# Patient Record
Sex: Female | Born: 1985 | Race: Black or African American | Hispanic: No | Marital: Single | Smoking: Never smoker
Health system: Southern US, Community
[De-identification: ages and names within clinical notes are randomized; demographics above are authoritative.]

---

## 2008-02-05 ENCOUNTER — Emergency Department (HOSPITAL_COMMUNITY): Admission: EM | Admit: 2008-02-05 | Discharge: 2008-02-05 | Payer: Self-pay | Admitting: Emergency Medicine

## 2008-02-07 ENCOUNTER — Emergency Department (HOSPITAL_COMMUNITY): Admission: EM | Admit: 2008-02-07 | Discharge: 2008-02-07 | Payer: Self-pay | Admitting: Family Medicine

## 2008-06-20 ENCOUNTER — Emergency Department (HOSPITAL_COMMUNITY): Admission: EM | Admit: 2008-06-20 | Discharge: 2008-06-20 | Payer: Self-pay | Admitting: Emergency Medicine

## 2008-06-22 ENCOUNTER — Emergency Department (HOSPITAL_COMMUNITY): Admission: EM | Admit: 2008-06-22 | Discharge: 2008-06-22 | Payer: Self-pay | Admitting: Emergency Medicine

## 2009-09-09 ENCOUNTER — Emergency Department (HOSPITAL_COMMUNITY): Admission: EM | Admit: 2009-09-09 | Discharge: 2009-09-09 | Payer: Self-pay | Admitting: Emergency Medicine

## 2010-04-12 LAB — WET PREP, GENITAL: Trich, Wet Prep: NONE SEEN

## 2010-04-12 LAB — GC/CHLAMYDIA PROBE AMP, GENITAL: Chlamydia, DNA Probe: NEGATIVE

## 2010-09-13 IMAGING — CR DG ELBOW COMPLETE 3+V*L*
4 series · 4 of 4 positions shown · non-contrast
Comparison: None

CLINICAL DATA: Pain status post fall

LEFT ELBOW - COMPLETE 3+ VIEW

[x elbow joint ap left]
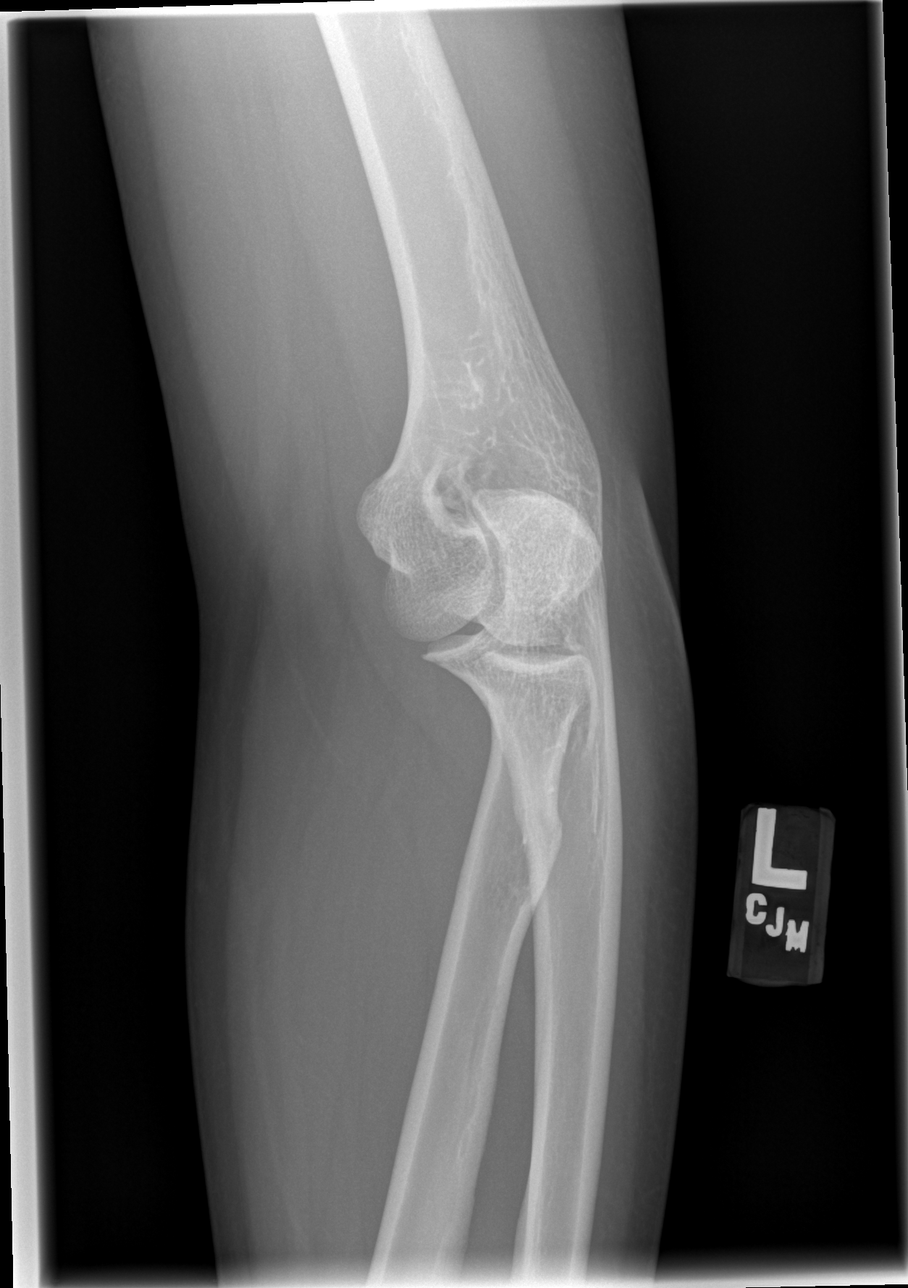

[x elbow joint obl. left (1 of 2)]
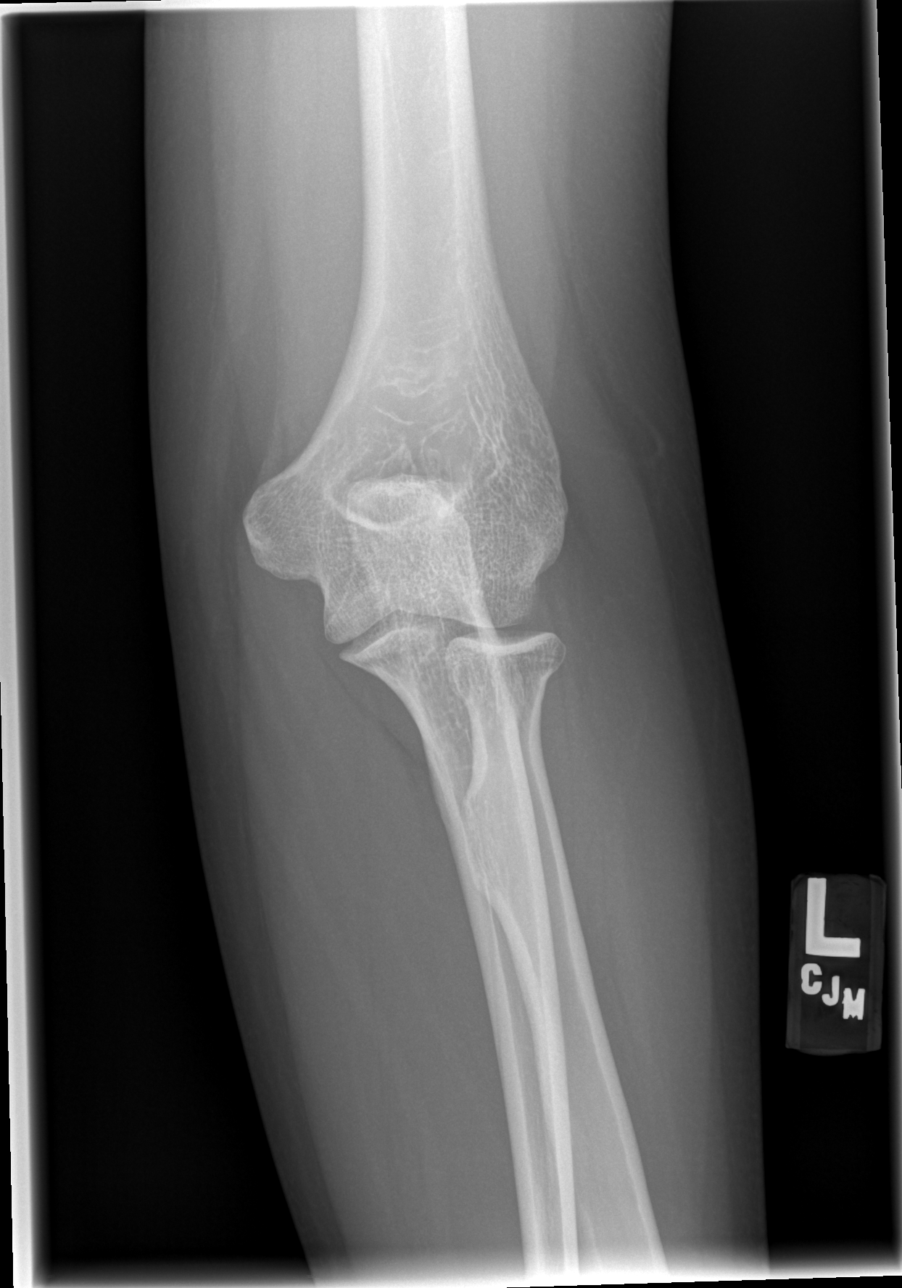

[x elbow joint obl. left (2 of 2)]
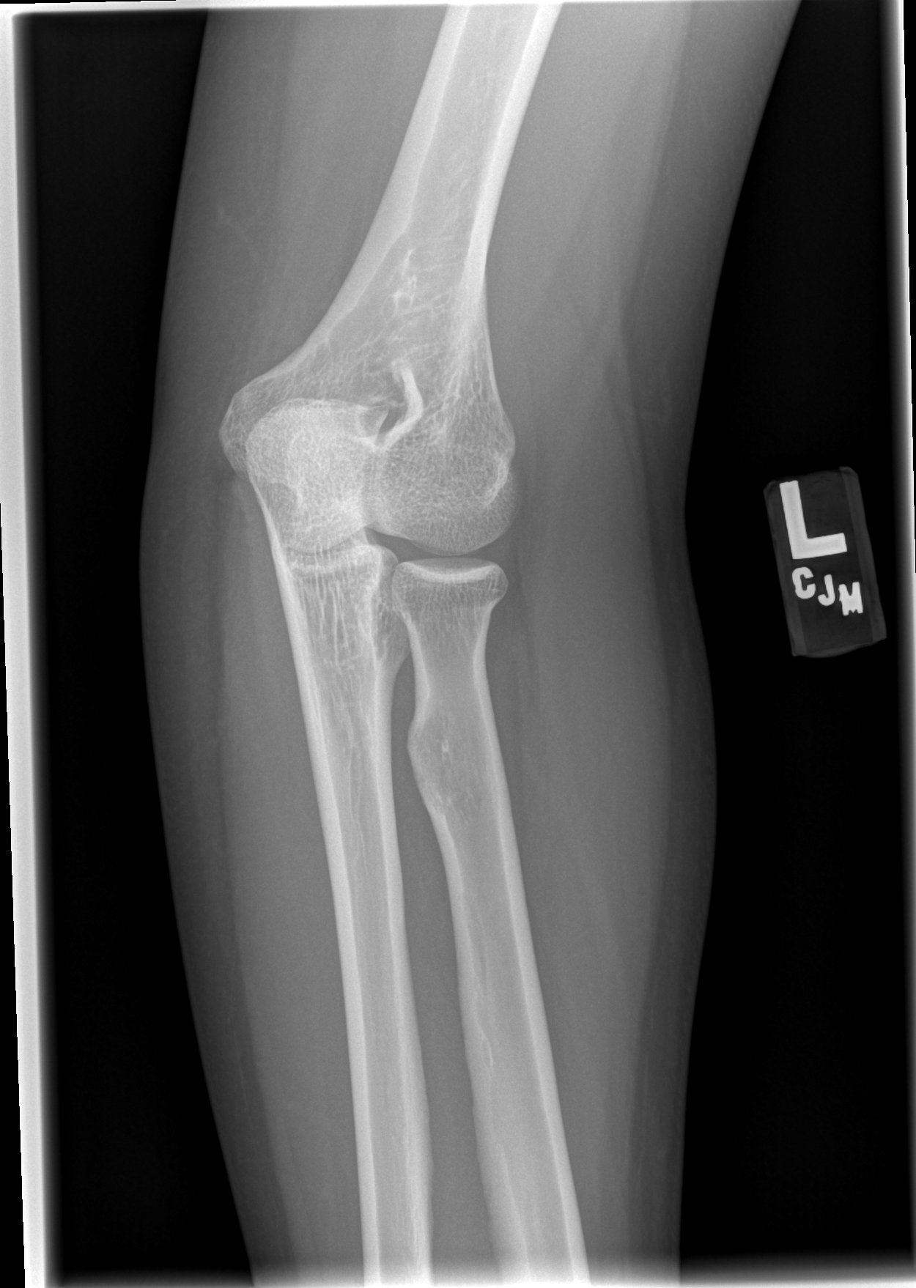

[x elbow joint lat left]
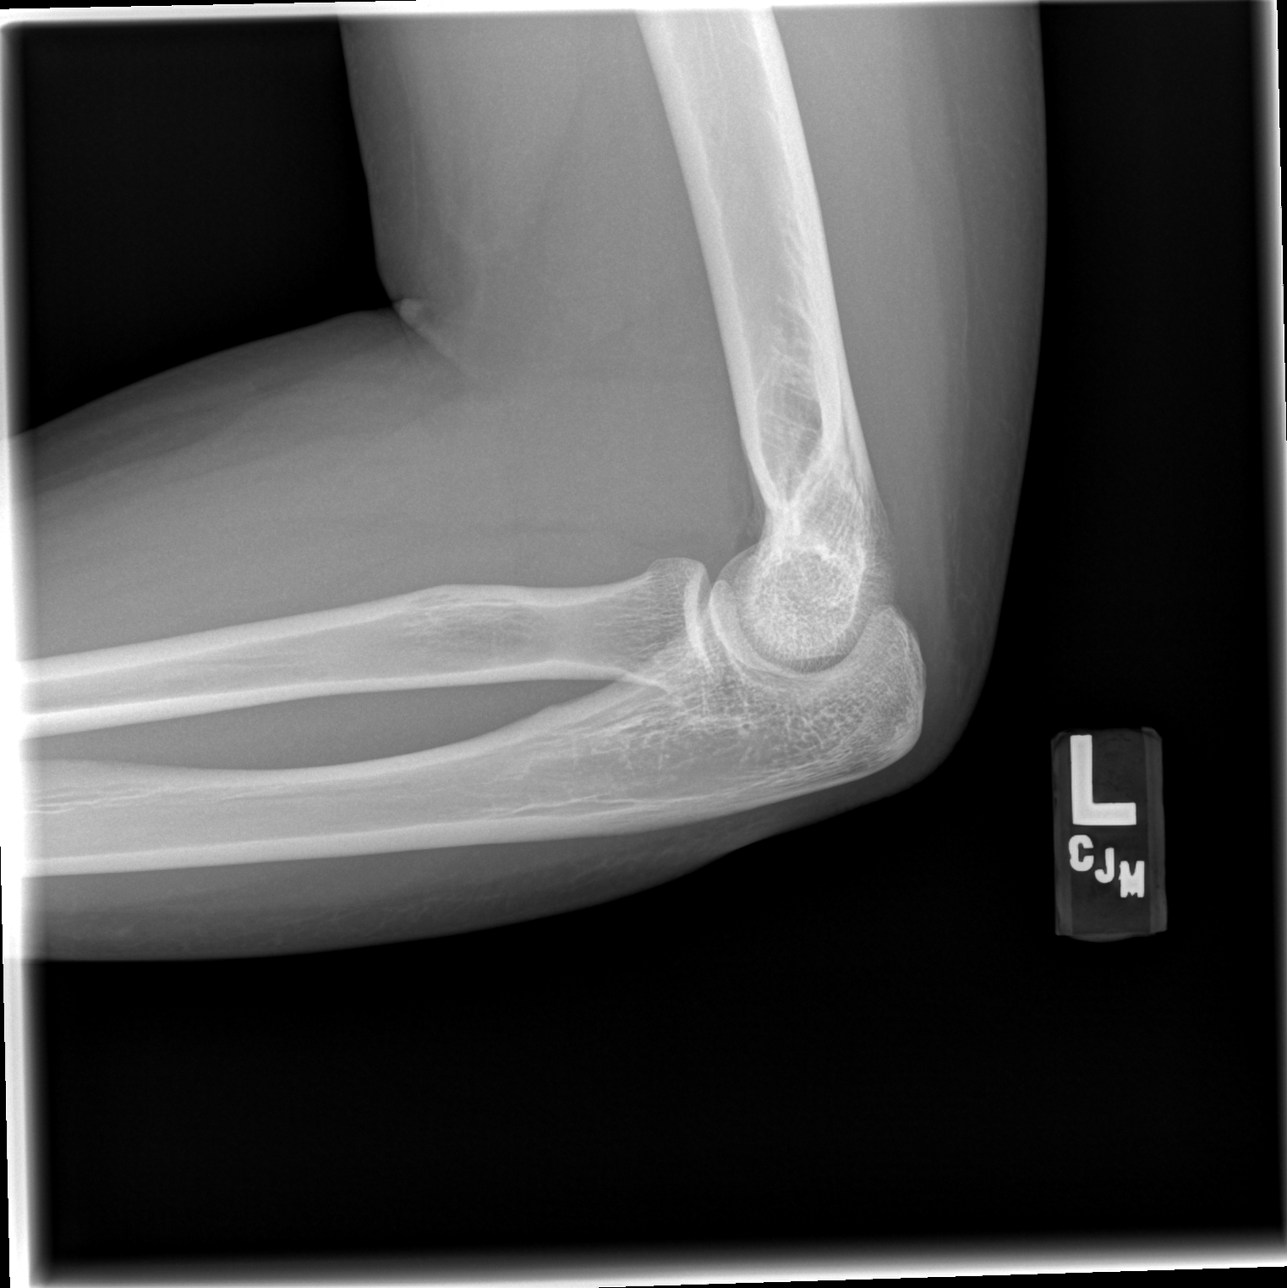

[4 of 4 positions shown; findings below may reference images not displayed]

FINDINGS: Bone mineralization normal.
Joint spaces preserved.
No fracture, dislocation, or bone destruction.
No joint effusion.
IMPRESSION: No definite acute bony abnormalities.

## 2013-01-30 ENCOUNTER — Other Ambulatory Visit (HOSPITAL_COMMUNITY)
Admission: RE | Admit: 2013-01-30 | Discharge: 2013-01-30 | Disposition: A | Discharge status: Home / Self Care | Payer: No Typology Code available for payment source | Source: Ambulatory Visit | Attending: Family Medicine | Admitting: Family Medicine

## 2013-01-30 ENCOUNTER — Encounter (HOSPITAL_COMMUNITY): Payer: Self-pay | Admitting: Emergency Medicine

## 2013-01-30 ENCOUNTER — Emergency Department (HOSPITAL_COMMUNITY)
Admission: EM | Admit: 2013-01-30 | Discharge: 2013-01-30 | Disposition: A | Discharge status: Home / Self Care | Payer: No Typology Code available for payment source | Source: Home / Self Care

## 2013-01-30 DIAGNOSIS — N760 Acute vaginitis: Secondary | ICD-10-CM

## 2013-01-30 DIAGNOSIS — Z113 Encounter for screening for infections with a predominantly sexual mode of transmission: Secondary | ICD-10-CM | POA: Insufficient documentation

## 2013-01-30 LAB — POCT PREGNANCY, URINE: PREG TEST UR: NEGATIVE

## 2013-01-30 MED ORDER — LIDOCAINE HCL (PF) 1 % IJ SOLN
INTRAMUSCULAR | Status: AC
  Filled 2013-01-30: qty 5

## 2013-01-30 MED ORDER — CEFTRIAXONE SODIUM 250 MG IJ SOLR
250.0000 mg | Freq: Once | INTRAMUSCULAR | Status: AC
  Administered 2013-01-30: 250 mg via INTRAMUSCULAR

## 2013-01-30 MED ORDER — AZITHROMYCIN 250 MG PO TABS
1000.0000 mg | ORAL_TABLET | Freq: Once | ORAL | Status: AC
  Administered 2013-01-30: 1000 mg via ORAL

## 2013-01-30 MED ORDER — AZITHROMYCIN 250 MG PO TABS
ORAL_TABLET | ORAL | Status: AC
  Filled 2013-01-30: qty 4

## 2013-01-30 MED ORDER — CEFTRIAXONE SODIUM 250 MG IJ SOLR
INTRAMUSCULAR | Status: AC
  Filled 2013-01-30: qty 250

## 2013-01-30 NOTE — ED Notes (Signed)
C/o vaginal discharge with odor onset 1 week ago after her period ended.  Hx. same and it was bacterial vaginosis.  Denies that it could be an STD.

## 2013-01-30 NOTE — Discharge Instructions (Signed)
You have been treated for gonorrhea and chlamydia tonight. If your culture results indicate the need for additional treatment, you will be notified by telephone. No intercourse until resolution of symptoms.

## 2013-01-30 NOTE — ED Provider Notes (Signed)
CSN: [id number]     Arrival date & time 01/30/13  1649 History   None    Chief Complaint  Patient presents with  . Vaginal Discharge   (Consider location/radiation/quality/duration/timing/severity/associated sxs/prior Treatment) HPI Comments: LNMP 01/16/2013  Patient is a 28 y.o. female presenting with vaginal discharge. The history is provided by the patient.  Vaginal Discharge Quality:  Malodorous and yellow Onset quality:  Gradual Duration:  1 week Timing:  Constant Progression:  Unchanged Chronicity:  New Associated symptoms: no abdominal pain, no dyspareunia, no dysuria, no fever, no genital lesions, no nausea, no rash, no urinary frequency, no urinary hesitancy, no urinary incontinence, no vaginal itching and no vomiting   Risk factors comment:  Prior hx of BV   History reviewed. No pertinent past medical history. History reviewed. No pertinent past surgical history. Family History  Problem Relation Age of Onset  . Diabetes Father    History  Substance Use Topics  . Smoking status: Never Smoker   . Smokeless tobacco: Not on file  . Alcohol Use: Yes     Comment: occasional   OB History   Grav Para Term Preterm Abortions TAB SAB Ect Mult Living                 Review of Systems  Constitutional: Negative for fever.  Gastrointestinal: Negative for nausea, vomiting and abdominal pain.  Genitourinary: Positive for vaginal discharge. Negative for bladder incontinence, dysuria, hesitancy and dyspareunia.  All other systems reviewed and are negative.    Allergies  Review of patient's allergies indicates no known allergies.  Home Medications  No current outpatient prescriptions on file. BP 112/71  Pulse 75  Temp(Src) 98.4 F (36.9 C) (Oral)  Resp 18  SpO2 98%  LMP 01/16/2013 Physical Exam  Nursing note and vitals reviewed. Constitutional: She is oriented to person, place, and time. She appears well-developed and well-nourished. No distress.  HENT:  Head:  Normocephalic and atraumatic.  Eyes: Conjunctivae are normal.  Neck: Normal range of motion. Neck supple.  Cardiovascular: Normal rate.   Pulmonary/Chest: Effort normal.  Abdominal: Soft. She exhibits no distension. There is no tenderness.  Genitourinary: Uterus normal. No labial fusion. There is no rash, tenderness, lesion or injury on the right labia. There is no rash, tenderness, lesion or injury on the left labia. Cervix exhibits no motion tenderness, no discharge and no friability. Right adnexum displays no mass, no tenderness and no fullness. Left adnexum displays no mass, no tenderness and no fullness. No erythema, tenderness or bleeding around the vagina. No foreign body around the vagina. Vaginal discharge found.  Musculoskeletal: Normal range of motion.  Neurological: She is alert and oriented to person, place, and time.  Skin: Skin is warm and dry. No rash noted.  Psychiatric: She has a normal mood and affect. Her behavior is normal.    ED Course  Procedures (including critical care time) Labs Review Labs Reviewed - No data to display Imaging Review No results found.  EKG Interpretation    Date/Time:    Ventricular Rate:    PR Interval:    QRS Duration:   QT Interval:    QTC Calculation:   R Axis:     Text Interpretation:              MDM  cervicovaginal swabs sent. No clinical evidence of PID or TOA. Patient chooses to have empiric treatment for GC and chlamydia. Will notify of additional culture results when available. Declined syphilis or HIV testing.  Jess BartersJennifer Lee Frisco CityPresson, [place] 01/30/13 [id number]

## 2013-01-31 NOTE — ED Provider Notes (Signed)
Medical screening examination/treatment/procedure(s) were performed by a resident physician or non-physician practitioner and as the supervising physician I was immediately available for consultation/collaboration.  Clementeen GrahamEvan Tabytha Gradillas, MD    Rodolph BongEvan S Amandalynn Pitz, MD 01/31/13 2111

## 2013-02-02 ENCOUNTER — Telehealth (HOSPITAL_COMMUNITY): Payer: Self-pay | Admitting: *Deleted

## 2013-02-02 NOTE — ED Notes (Addendum)
GC/Chlamydia neg., Affirm: Candida and Trich neg., Gardnerella pos. 1/6   Message from Rite AidLee Presson PA to call Rx. of Flagyl 500 mg. BID x 7 days #14, if pt. would like to be treated. I called and left a message to call. Call 1. Vassie MoselleYork, Kenyana Husak M 02/02/2013 1/9 I called pt. and left a message to call.  When I opened pt.'s chart to document, I could see that she came in yesterday and had her Rx. of Flagyl called in already.  I called pt. back on her VM and told her to disregard my previous call because I noted on her chart that she was notified and treated yesterday.

## 2013-02-03 NOTE — ED Notes (Signed)
Patient came to [facility] for results, verified results with smith, pa.  .  Reported results to patient, called in flagyl order as ordered by lee presson, pa in previous documentation.  Verified medication order with smith, pa.

## 2013-02-15 NOTE — ED Notes (Signed)
Pt. called on my VM 1/6 for her lab results. Pt already got the results. 02/15/2013

## 2013-06-18 ENCOUNTER — Encounter (HOSPITAL_COMMUNITY): Payer: Self-pay | Admitting: Emergency Medicine

## 2013-06-18 ENCOUNTER — Emergency Department (HOSPITAL_COMMUNITY)
Admission: EM | Admit: 2013-06-18 | Discharge: 2013-06-18 | Disposition: A | Discharge status: Home / Self Care | Payer: No Typology Code available for payment source | Source: Home / Self Care | Attending: Family Medicine | Admitting: Family Medicine

## 2013-06-18 ENCOUNTER — Other Ambulatory Visit (HOSPITAL_COMMUNITY)
Admission: RE | Admit: 2013-06-18 | Discharge: 2013-06-18 | Disposition: A | Discharge status: Home / Self Care | Payer: No Typology Code available for payment source | Source: Ambulatory Visit | Attending: Family Medicine | Admitting: Family Medicine

## 2013-06-18 DIAGNOSIS — B9689 Other specified bacterial agents as the cause of diseases classified elsewhere: Secondary | ICD-10-CM

## 2013-06-18 DIAGNOSIS — Z113 Encounter for screening for infections with a predominantly sexual mode of transmission: Secondary | ICD-10-CM | POA: Insufficient documentation

## 2013-06-18 DIAGNOSIS — N760 Acute vaginitis: Secondary | ICD-10-CM | POA: Insufficient documentation

## 2013-06-18 DIAGNOSIS — A499 Bacterial infection, unspecified: Secondary | ICD-10-CM

## 2013-06-18 MED ORDER — METRONIDAZOLE 500 MG PO TABS: 500.0000 mg | ORAL_TABLET | Freq: Two times a day (BID) | ORAL | Status: DC

## 2013-06-18 NOTE — ED Provider Notes (Signed)
CSN: [id number]     Arrival date & time 06/18/13  1649 History   First MD Initiated Contact with Patient 06/18/13 1808     Chief Complaint  Patient presents with  . Vaginal Discharge   (Consider location/radiation/quality/duration/timing/severity/associated sxs/prior Treatment) Patient is a 28 y.o. female presenting with vaginal discharge. The history is provided by the patient.  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 No pertinent past medical history. History reviewed. No pertinent past surgical history. Family History  Problem Relation Age of Onset  . Diabetes Father    History  Substance Use Topics  . Smoking status: Never Smoker   . Smokeless tobacco: Not on file  . Alcohol Use: Yes     Comment: occasional   OB History   Grav Para Term Preterm Abortions TAB SAB Ect Mult Living                 Review of Systems  Constitutional: Negative for fever.  Gastrointestinal: Negative for abdominal pain.  Genitourinary: Positive for vaginal discharge. Negative for dysuria, urgency, vaginal bleeding, menstrual problem and pelvic pain.    Allergies  Review of patient's allergies indicates no known allergies.  Home Medications   Prior to Admission medications   Not on File   BP 121/65  Pulse 79  Temp(Src) 98.2 F (36.8 C) (Oral)  Resp 18  SpO2 100%  LMP 05/21/2013 Physical Exam  Nursing note and vitals reviewed. Constitutional: She is oriented to person, place, and time. She appears well-developed and well-nourished.  Abdominal: Soft. Bowel sounds are normal.  Genitourinary: Uterus normal. Cervix exhibits no motion tenderness, no discharge and no friability. Right adnexum displays no tenderness. Left adnexum displays no tenderness. No erythema,  tenderness or bleeding around the vagina. Vaginal discharge found.  Pos whiff test.  Neurological: She is alert and oriented to person, place, and time.  Skin: Skin is warm and dry.    ED Course  Procedures (including critical care time) Labs Review Labs Reviewed - No data to display  Imaging Review No results found.   MDM   1. BV (bacterial vaginosis)       Linna Hoff, MD 06/18/13 [phone]

## 2013-06-18 NOTE — Discharge Instructions (Signed)
Use medicine as prescribed, we will call if tests show a need for other treatment.  

## 2013-06-18 NOTE — ED Notes (Signed)
Pt here with c/o vaginal yellowish d/c with odor that started x 3 dys ago Denies n/v/fever or back pain LMP- 4/25

## 2013-06-21 NOTE — ED Notes (Signed)
GC/Chlamydia neg., Affirm: Candida and Trich neg., Gardnerella pos.  Pt. adequately treated with Flagyl. Ricarda Atayde M Krystian Ferrentino 06/21/2013  

## 2016-02-12 ENCOUNTER — Other Ambulatory Visit: Payer: Self-pay | Admitting: Obstetrics & Gynecology

## 2016-02-12 DIAGNOSIS — N6452 Nipple discharge: Secondary | ICD-10-CM

## 2016-02-19 ENCOUNTER — Other Ambulatory Visit: Payer: No Typology Code available for payment source

## 2019-01-14 ENCOUNTER — Ambulatory Visit: Payer: No Typology Code available for payment source | Attending: Internal Medicine

## 2019-01-14 DIAGNOSIS — Z20822 Contact with and (suspected) exposure to covid-19: Secondary | ICD-10-CM

## 2019-01-15 LAB — NOVEL CORONAVIRUS, NAA: SARS-CoV-2, NAA: NOT DETECTED

## 2019-01-17 ENCOUNTER — Other Ambulatory Visit: Payer: No Typology Code available for payment source

## 2019-11-24 LAB — HIV ANTIBODY (ROUTINE TESTING W REFLEX): HIV 1&2 Ab, 4th Generation: NEGATIVE

## 2019-11-24 LAB — TESTOSTERONE: Testosterone: NORMAL

## 2020-02-07 ENCOUNTER — Other Ambulatory Visit: Payer: No Typology Code available for payment source

## 2020-02-07 ENCOUNTER — Other Ambulatory Visit: Payer: Self-pay

## 2020-02-07 DIAGNOSIS — Z20822 Contact with and (suspected) exposure to covid-19: Secondary | ICD-10-CM

## 2020-02-09 LAB — NOVEL CORONAVIRUS, NAA: SARS-CoV-2, NAA: NOT DETECTED

## 2020-02-09 LAB — SARS-COV-2, NAA 2 DAY TAT

## 2020-02-21 ENCOUNTER — Other Ambulatory Visit: Payer: Self-pay

## 2020-02-21 ENCOUNTER — Other Ambulatory Visit: Payer: No Typology Code available for payment source

## 2020-02-21 DIAGNOSIS — Z20822 Contact with and (suspected) exposure to covid-19: Secondary | ICD-10-CM

## 2020-02-22 LAB — NOVEL CORONAVIRUS, NAA: SARS-CoV-2, NAA: NOT DETECTED

## 2020-02-22 LAB — SARS-COV-2, NAA 2 DAY TAT

## 2020-06-05 ENCOUNTER — Other Ambulatory Visit: Payer: Self-pay

## 2020-06-06 ENCOUNTER — Ambulatory Visit (INDEPENDENT_AMBULATORY_CARE_PROVIDER_SITE_OTHER): Payer: Managed Care, Other (non HMO) | Admitting: Nurse Practitioner

## 2020-06-06 ENCOUNTER — Encounter: Payer: Self-pay | Admitting: Nurse Practitioner

## 2020-06-06 VITALS — BP 120/74 | HR 88 | Temp 98.1°F | Ht 68.0 in | Wt 214.4 lb

## 2020-06-06 DIAGNOSIS — Z136 Encounter for screening for cardiovascular disorders: Secondary | ICD-10-CM

## 2020-06-06 DIAGNOSIS — Z13220 Encounter for screening for lipoid disorders: Secondary | ICD-10-CM | POA: Diagnosis not present

## 2020-06-06 DIAGNOSIS — M25552 Pain in left hip: Secondary | ICD-10-CM

## 2020-06-06 DIAGNOSIS — Z0001 Encounter for general adult medical examination with abnormal findings: Secondary | ICD-10-CM

## 2020-06-06 NOTE — Progress Notes (Signed)
Subjective:    Patient ID: Tina Mcbride, female    DOB: July 14, 1985, 35 y.o.   MRN: [id number]  Patient presents today for CPE and eval of an acute condition  Hip Pain  Incident onset: 72months ago. The incident occurred at home. The injury mechanism was a twisting injury. The pain is present in the left hip and left thigh. The quality of the pain is described as aching. The pain is mild. The pain has been intermittent since onset. Pertinent negatives include no inability to bear weight, loss of motion, loss of sensation, muscle weakness, numbness or tingling. She reports no foreign bodies present. The symptoms are aggravated by movement (external rotation of hip). She has tried nothing for the symptoms.   Sexual History (orientation,birth control, marital status, STD):sexually active, female partner, use of condoms, pelvic and breast exam completed by GYN per aptient  Depression/Suicide: Depression screen [facility] 2/9 06/06/2020  Decreased Interest 0  Down, Depressed, Hopeless 0  PHQ - 2 Score 0  Altered sleeping 0  Tired, decreased energy 2  Change in appetite 0  Feeling bad or failure about yourself  0  Trouble concentrating 0  Moving slowly or fidgety/restless 0  Suicidal thoughts 0  PHQ-9 Score 2  Difficult doing work/chores Somewhat difficult   Vision:up to date  Dental:up to date  Immunizations: (TDAP, Hep C screen, Pneumovax, Influenza, zoster)  Health Maintenance  Topic Date Due  . Tetanus Vaccine  Never done  . Pap Smear  Never done  . COVID-19 Vaccine (3 - Booster for Pfizer series) 03/10/2020  . Hepatitis C Screening: USPSTF Recommendation to screen - Ages 18-79 yo.  06/06/2021*  . HIV Screening  06/06/2021*  . Flu Shot  08/27/2020  . HPV Vaccine  Aged Out  *Topic was postponed. The date shown is not the original due date.   Diet:regular Exercise: 2-3x/week: treadmill and videos Weight:  Wt Readings from Last 3 Encounters:  06/06/20 214 lb 6.4 oz (97.3 kg)   Fall  Risk: Fall Risk  06/06/2020  Falls in the past year? 0  Number falls in past yr: 0  Injury with Fall? 0  Risk for fall due to : No Fall Risks  Follow up Falls evaluation completed   Medications and allergies reviewed with patient and updated if appropriate.  There are no problems to display for this patient.   Current Outpatient Medications on File Prior to Visit  Medication Sig Dispense Refill  . metroNIDAZOLE (FLAGYL) 500 MG tablet Take 1 tablet (500 mg total) by mouth 2 (two) times daily. (Patient not taking: Reported on 06/06/2020) 14 tablet 0   No current facility-administered medications on file prior to visit.    History reviewed. No pertinent past medical history.  History reviewed. No pertinent surgical history.  Social History   Socioeconomic History  . Marital status: Single    Spouse name: Not on file  . Number of children: Not on file  . Years of education: Not on file  . Highest education level: Not on file  Occupational History  . Not on file  Tobacco Use  . Smoking status: Never Smoker  . Smokeless tobacco: Never Used  Vaping Use  . Vaping Use: Never used  Substance and Sexual Activity  . Alcohol use: Yes    Comment: occasional  . Drug use: No  . Sexual activity: Yes    Birth control/protection: Condom  Other Topics Concern  . Not on file  Social History Narrative  . Not  on file   Social Determinants of Health   Financial Resource Strain: Not on file  Food Insecurity: Not on file  Transportation Needs: Not on file  Physical Activity: Not on file  Stress: Not on file  Social Connections: Not on file    Family History  Problem Relation Age of Onset  . Diabetes Father         Review of Systems  Constitutional: Negative for fever, malaise/fatigue and weight loss.  HENT: Negative for congestion and sore throat.   Eyes:       Negative for visual changes  Respiratory: Negative for cough and shortness of breath.   Cardiovascular: Negative  for chest pain, palpitations and leg swelling.  Gastrointestinal: Negative for blood in stool, constipation, diarrhea and heartburn.  Genitourinary: Negative for dysuria, frequency and urgency.  Musculoskeletal: Negative for falls, joint pain and myalgias.  Skin: Negative for rash.  Neurological: Negative for dizziness, tingling, sensory change, numbness and headaches.  Endo/Heme/Allergies: Does not bruise/bleed easily.  Psychiatric/Behavioral: Negative for depression, substance abuse and suicidal ideas. The patient is not nervous/anxious.     Objective:   Vitals:   06/06/20 1333  BP: 120/74  Pulse: 88  Temp: 98.1 F (36.7 C)  SpO2: 98%   Body mass index is 32.6 kg/m.  Physical Examination:  Physical Exam Vitals reviewed.  Constitutional:      General: She is not in acute distress. HENT:     Right Ear: Tympanic membrane, ear canal and external ear normal.     Left Ear: Tympanic membrane, ear canal and external ear normal.  Eyes:     General: No scleral icterus.    Extraocular Movements: Extraocular movements intact.     Conjunctiva/sclera: Conjunctivae normal.  Neck:     Thyroid: No thyromegaly.  Cardiovascular:     Rate and Rhythm: Normal rate and regular rhythm.     Heart sounds: Normal heart sounds.  Pulmonary:     Effort: Pulmonary effort is normal.     Breath sounds: Normal breath sounds.  Abdominal:     General: Bowel sounds are normal. There is no distension.     Palpations: Abdomen is soft.     Tenderness: There is no abdominal tenderness.  Genitourinary:    Vagina: Normal.     Comments: Deferred breast and pelvic exam to GYN Musculoskeletal:        General: No swelling, tenderness, deformity or signs of injury. Normal range of motion.     Cervical back: Normal range of motion and neck supple.     Right hip: Normal.     Left hip: Normal.     Right upper leg: Normal.     Left upper leg: Normal.     Right knee: Normal.     Left knee: Normal.     Right  lower leg: Normal. No edema.     Left lower leg: Normal. No edema.  Lymphadenopathy:     Cervical: No cervical adenopathy.  Skin:    General: Skin is warm and dry.  Neurological:     Mental Status: She is alert and oriented to person, place, and time.  Psychiatric:        Mood and Affect: Mood normal.        Behavior: Behavior normal.        Thought Content: Thought content normal.        Judgment: Judgment normal.    ASSESSMENT and PLAN: This visit occurred during the SARS-CoV-2 public health  emergency.  Safety protocols were in place, including screening questions prior to the visit, additional usage of staff PPE, and extensive cleaning of exam room while observing appropriate contact time as indicated for disinfecting solutions.   Ruthanna was seen today for establish care.  Diagnoses and all orders for this visit:  Encounter for preventative adult health care exam with abnormal findings -     CBC with Differential/Platelet; Future -     Comprehensive metabolic panel; Future -     Lipid panel; Future -     TSH; Future  Acute pain of left hip  Encounter for lipid screening for cardiovascular disease -     Lipid panel; Future  Start daily hip exercise x 4-6weeks. Apply cold compress on anterior hip after exercise x . Schedule fasting lab appt. Sign medical release to get records from GYN.    Problem List Items Addressed This Visit   None   Visit Diagnoses    Encounter for preventative adult health care exam with abnormal findings    -  Primary   Relevant Orders   CBC with Differential/Platelet   Comprehensive metabolic panel   Lipid panel   TSH   Acute pain of left hip       Encounter for lipid screening for cardiovascular disease       Relevant Orders   Lipid panel      Follow up: Return in about 1 year (around 06/06/2021) for CPE (fasting).  Alysia Penna, NP

## 2020-06-06 NOTE — Patient Instructions (Addendum)
Thank you for choosing [facility].  Schedule fasting lab appt.  Sign medical release to get records from GYN.  Apply cold compress on anterior hip after exercise x 77mns.  Hip Exercises Ask your health care provider which exercises are safe for you. Do exercises exactly as told by your health care provider and adjust them as directed. It is normal to feel mild stretching, pulling, tightness, or discomfort as you do these exercises. Stop right away if you feel sudden pain or your pain gets worse. Do not begin these exercises until told by your health care provider. Stretching and range-of-motion exercises These exercises warm up your muscles and joints and improve the movement and flexibility of your hip. These exercises also help to relieve pain, numbness, and tingling. You may be asked to limit your range of motion if you had a hip replacement. Talk to your health care provider about these restrictions. Hamstrings, supine 1. Lie on your back (supine position). 2. Loop a belt or towel over the ball of your left / right foot. The ball of your foot is on the walking surface, right under your toes. 3. Straighten your left / right knee and slowly pull on the belt or towel to raise your leg until you feel a gentle stretch behind your knee (hamstring). ? Do not let your knee bend while you do this. ? Keep your other leg flat on the floor. 4. Hold this position for __________ seconds. 5. Slowly return your leg to the starting position. Repeat __________ times. Complete this exercise __________ times a day.   Hip rotation 1. Lie on your back on a firm surface. 2. With your left / right hand, gently pull your left / right knee toward the shoulder that is on the same side of the body. Stop when your knee is pointing toward the ceiling. 3. Hold your left / right ankle with your other hand. 4. Keeping your knee steady, gently pull your left / right ankle toward your other shoulder until you  feel a stretch in your buttocks. ? Keep your hips and shoulders firmly planted while you do this stretch. 5. Hold this position for __________ seconds. Repeat __________ times. Complete this exercise __________ times a day.   Seated stretch This exercise is sometimes called hamstrings and adductors stretch. 1. Sit on the floor with your legs stretched wide. Keep your knees straight during this exercise. 2. Keeping your head and back in a straight line, bend at your waist to reach for your left foot (position A). You should feel a stretch in your right inner thigh (adductors). 3. Hold this position for __________ seconds. Then slowly return to the upright position. 4. Keeping your head and back in a straight line, bend at your waist to reach forward (position B). You should feel a stretch behind both of your thighs and knees (hamstrings). 5. Hold this position for __________ seconds. Then slowly return to the upright position. 6. Keeping your head and back in a straight line, bend at your waist to reach for your right foot (position C). You should feel a stretch in your left inner thigh (adductors). 7. Hold this position for __________ seconds. Then slowly return to the upright position. Repeat __________ times. Complete this exercise __________ times a day.   Lunge This exercise stretches the muscles of the hip (hip flexors). 1. Place your left / right knee on the floor and bend your other knee so that is directly over your ankle.  You should be half-kneeling. 2. Keep good posture with your head over your shoulders. 3. Tighten your buttocks to point your tailbone downward. This will prevent your back from arching too much. 4. You should feel a gentle stretch in the front of your left / right thigh and hip. If you do not feel a stretch, slide your other foot forward slightly and then slowly lunge forward with your chest up until your knee once again lines up over your ankle. ? Make sure your  tailbone continues to point downward. 5. Hold this position for __________ seconds. 6. Slowly return to the starting position. Repeat __________ times. Complete this exercise __________ times a day.   Strengthening exercises These exercises build strength and endurance in your hip. Endurance is the ability to use your muscles for a long time, even after they get tired. Bridge This exercise strengthens the muscles of your hip (hip extensors). 1. Lie on your back on a firm surface with your knees bent and your feet flat on the floor. 2. Tighten your buttocks muscles and lift your bottom off the floor until the trunk of your body and your hips are level with your thighs. ? Do not arch your back. ? You should feel the muscles working in your buttocks and the back of your thighs. If you do not feel these muscles, slide your feet 1-2 inches (2.5-5 cm) farther away from your buttocks. 3. Hold this position for __________ seconds. 4. Slowly lower your hips to the starting position. 5. Let your muscles relax completely between repetitions. Repeat __________ times. Complete this exercise __________ times a day.   Straight leg raises, side-lying This exercise strengthens the muscles that move the hip joint away from the center of the body (hip abductors). 1. Lie on your side with your left / right leg in the top position. Lie so your head, shoulder, hip, and knee line up. You may bend your bottom knee slightly to help you balance. 2. Roll your hips slightly forward, so your hips are stacked directly over each other and your left / right knee is facing forward. 3. Leading with your heel, lift your top leg 4-6 inches (10-15 cm). You should feel the muscles in your top hip lifting. ? Do not let your foot drift forward. ? Do not let your knee roll toward the ceiling. 4. Hold this position for __________ seconds. 5. Slowly return to the starting position. 6. Let your muscles relax completely between  repetitions. Repeat __________ times. Complete this exercise __________ times a day.   Straight leg raises, side-lying This exercise strengthens the muscles that move the hip joint toward the center of the body (hip adductors). 1. Lie on your side with your left / right leg in the bottom position. Lie so your head, shoulder, hip, and knee line up. You may place your upper foot in front to help you balance. 2. Roll your hips slightly forward, so your hips are stacked directly over each other and your left / right knee is facing forward. 3. Tense the muscles in your inner thigh and lift your bottom leg 4-6 inches (10-15 cm). 4. Hold this position for __________ seconds. 5. Slowly return to the starting position. 6. Let your muscles relax completely between repetitions. Repeat __________ times. Complete this exercise __________ times a day.   Straight leg raises, supine This exercise strengthens the muscles in the front of your thigh (quadriceps). 1. Lie on your back (supine position) with your left /  right leg extended and your other knee bent. 2. Tense the muscles in the front of your left / right thigh. You should see your kneecap slide up or see increased dimpling just above your knee. 3. Keep these muscles tight as you raise your leg 4-6 inches (10-15 cm) off the floor. Do not let your knee bend. 4. Hold this position for __________ seconds. 5. Keep these muscles tense as you lower your leg. 6. Relax the muscles slowly and completely between repetitions. Repeat __________ times. Complete this exercise __________ times a day.   Hip abductors, standing This exercise strengthens the muscles that move the leg and hip joint away from the center of the body (hip abductors). 1. Tie one end of a rubber exercise band or tubing to a secure surface, such as a chair, table, or pole. 2. Loop the other end of the band or tubing around your left / right ankle. 3. Keeping your ankle with the band or tubing  directly opposite the secured end, step away until there is tension in the tubing or band. Hold on to a chair, table, or pole as needed for balance. 4. Lift your left / right leg out to your side. While you do this: ? Keep your back upright. ? Keep your shoulders over your hips. ? Keep your toes pointing forward. ? Make sure to use your hip muscles to slowly lift your leg. Do not tip your body or forcefully lift your leg. 5. Hold this position for __________ seconds. 6. Slowly return to the starting position. Repeat __________ times. Complete this exercise __________ times a day. Squats This exercise strengthens the muscles in the front of your thigh (quadriceps). 1. Stand in a door frame so your feet and knees are in line with the frame. You may place your hands on the frame for balance. 2. Slowly bend your knees and lower your hips like you are going to sit in a chair. ? Keep your lower legs in a straight-up-and-down position. ? Do not let your hips go lower than your knees. ? Do not bend your knees lower than told by your health care provider. ? If your hip pain increases, do not bend as low. 3. Hold this position for ___________ seconds. 4. Slowly push with your legs to return to standing. Do not use your hands to pull yourself to standing. Repeat __________ times. Complete this exercise __________ times a day. This information is not intended to replace advice given to you by your health care provider. Make sure you discuss any questions you have with your health care provider. Document Revised: 08/19/2018 Document Reviewed: 11/24/2017 Elsevier Patient Education  2021 [place] 6-65 Years Old, Female Preventive care refers to lifestyle choices and visits with your health care provider that can promote health and wellness. This includes:  A yearly physical exam. This is also called an annual wellness visit.  Regular dental and eye  exams.  Immunizations.  Screening for certain conditions.  Healthy lifestyle choices, such as: ? Eating a healthy diet. ? Getting regular exercise. ? Not using drugs or products that contain nicotine and tobacco. ? Limiting alcohol use. What can I expect for my preventive care visit? Physical exam Your health care provider may check your:  Height and weight. These may be used to calculate your BMI (body mass index). BMI is a measurement that tells if you are at a healthy weight.  Heart rate and blood pressure.  Body temperature.  Skin for abnormal spots. Counseling Your health care provider may ask you questions about your:  Past medical problems.  Family's medical history.  Alcohol, tobacco, and drug use.  Emotional well-being.  Home life and relationship well-being.  Sexual activity.  Diet, exercise, and sleep habits.  Work and work Statistician.  Access to firearms.  Method of birth control.  Menstrual cycle.  Pregnancy history. What immunizations do I need? Vaccines are usually given at various ages, according to a schedule. Your health care provider will recommend vaccines for you based on your age, medical history, and lifestyle or other factors, such as travel or where you work.   What tests do I need? Blood tests  Lipid and cholesterol levels. These may be checked every 5 years starting at age 58.  Hepatitis C test.  Hepatitis B test. Screening  Diabetes screening. This is done by checking your blood sugar (glucose) after you have not eaten for a while (fasting).  STD (sexually transmitted disease) testing, if you are at risk.  BRCA-related cancer screening. This may be done if you have a family history of breast, ovarian, tubal, or peritoneal cancers.  Pelvic exam and Pap test. This may be done every 3 years starting at age 37. Starting at age 70, this may be done every 5 years if you have a Pap test in combination with an HPV test. Talk with  your health care provider about your test results, treatment options, and if necessary, the need for more tests.   Follow these instructions at home: Eating and drinking  Eat a healthy diet that includes fresh fruits and vegetables, whole grains, lean protein, and low-fat dairy products.  Take vitamin and mineral supplements as recommended by your health care provider.  Do not drink alcohol if: ? Your health care provider tells you not to drink. ? You are pregnant, may be pregnant, or are planning to become pregnant.  If you drink alcohol: ? Limit how much you have to 0-1 drink a day. ? Be aware of how much alcohol is in your drink. In the [place] one drink equals one 12 oz bottle of beer (355 mL), one 5 oz glass of wine (148 mL), or one 1 oz glass of hard liquor (44 mL).   Lifestyle  Take daily care of your teeth and gums. Brush your teeth every morning and night with fluoride toothpaste. Floss one time each day.  Stay active. Exercise for at least 30 minutes 5 or more days each week.  Do not use any products that contain nicotine or tobacco, such as cigarettes, e-cigarettes, and chewing tobacco. If you need help quitting, ask your health care provider.  Do not use drugs.  If you are sexually active, practice safe sex. Use a condom or other form of protection to prevent STIs (sexually transmitted infections).  If you do not wish to become pregnant, use a form of birth control. If you plan to become pregnant, see your health care provider for a prepregnancy visit.  Find healthy ways to cope with stress, such as: ? Meditation, yoga, or listening to music. ? Journaling. ? Talking to a trusted person. ? Spending time with friends and family. Safety  Always wear your seat belt while driving or riding in a vehicle.  Do not drive: ? If you have been drinking alcohol. Do not ride with someone who has been drinking. ? When you are tired or distracted. ? While texting.  Wear a helmet  and other protective  equipment during sports activities.  If you have firearms in your house, make sure you follow all gun safety procedures.  Seek help if you have been physically or sexually abused. What's next?  Go to your health care provider once a year for an annual wellness visit.  Ask your health care provider how often you should have your eyes and teeth checked.  Stay up to date on all vaccines. This information is not intended to replace advice given to you by your health care provider. Make sure you discuss any questions you have with your health care provider. Document Revised: 09/11/2019 Document Reviewed: 09/24/2017 Elsevier Patient Education  2021 [employer].

## 2020-06-08 ENCOUNTER — Other Ambulatory Visit: Payer: Self-pay

## 2020-06-08 ENCOUNTER — Other Ambulatory Visit (INDEPENDENT_AMBULATORY_CARE_PROVIDER_SITE_OTHER): Payer: Managed Care, Other (non HMO)

## 2020-06-08 DIAGNOSIS — Z13220 Encounter for screening for lipoid disorders: Secondary | ICD-10-CM

## 2020-06-08 DIAGNOSIS — Z0001 Encounter for general adult medical examination with abnormal findings: Secondary | ICD-10-CM

## 2020-06-08 DIAGNOSIS — Z136 Encounter for screening for cardiovascular disorders: Secondary | ICD-10-CM

## 2020-06-08 LAB — COMPREHENSIVE METABOLIC PANEL
ALT: 15 U/L (ref 0–35)
AST: 15 U/L (ref 0–37)
Albumin: 4.2 g/dL (ref 3.5–5.2)
Alkaline Phosphatase: 39 U/L (ref 39–117)
BUN: 8 mg/dL (ref 6–23)
CO2: 28 mEq/L (ref 19–32)
Calcium: 8.9 mg/dL (ref 8.4–10.5)
Chloride: 105 mEq/L (ref 96–112)
Creatinine, Ser: 0.83 mg/dL (ref 0.40–1.20)
GFR: 91.38 mL/min (ref >60.00)
Glucose, Bld: 87 mg/dL (ref 70–99)
Potassium: 4 mEq/L (ref 3.5–5.1)
Sodium: 140 mEq/L (ref 135–145)
Total Bilirubin: 0.7 mg/dL (ref 0.2–1.2)
Total Protein: 6.5 g/dL (ref 6.0–8.3)

## 2020-06-08 LAB — CBC WITH DIFFERENTIAL/PLATELET
Basophils Absolute: 0.1 10*3/uL (ref 0.0–0.1)
Basophils Relative: 1.3 % (ref 0.0–3.0)
Eosinophils Absolute: 0 10*3/uL (ref 0.0–0.7)
Eosinophils Relative: 0.5 % (ref 0.0–5.0)
HCT: 37.8 % (ref 36.0–46.0)
Hemoglobin: 12.5 g/dL (ref 12.0–15.0)
Lymphocytes Relative: 40.3 % (ref 12.0–46.0)
Lymphs Abs: 2.3 10*3/uL (ref 0.7–4.0)
MCHC: 33.1 g/dL (ref 30.0–36.0)
MCV: 96.5 fl (ref 78.0–100.0)
Monocytes Absolute: 0.6 10*3/uL (ref 0.1–1.0)
Monocytes Relative: 10.6 % (ref 3.0–12.0)
Neutro Abs: 2.7 10*3/uL (ref 1.4–7.7)
Neutrophils Relative %: 47.3 % (ref 43.0–77.0)
Platelets: 299 10*3/uL (ref 150.0–400.0)
RBC: 3.91 Mil/uL (ref 3.87–5.11)
RDW: 12.9 % (ref 11.5–15.5)
WBC: 5.8 10*3/uL (ref 4.0–10.5)

## 2020-06-08 LAB — LIPID PANEL
Cholesterol: 143 mg/dL (ref 0–200)
HDL: 54.1 mg/dL (ref >39.00)
LDL Cholesterol: 75 mg/dL (ref 0–99)
NonHDL: 88.6
Total CHOL/HDL Ratio: 3
Triglycerides: 67 mg/dL (ref 0.0–149.0)
VLDL: 13.4 mg/dL (ref 0.0–40.0)

## 2020-06-08 LAB — TSH: TSH: 1.01 u[IU]/mL (ref 0.35–4.50)

## 2020-06-08 NOTE — Progress Notes (Signed)
Per orders of [facility], [place], pt is here for labs pt tolerated draw well.

## 2020-11-01 LAB — HM PAP SMEAR

## 2023-05-01 ENCOUNTER — Ambulatory Visit (INDEPENDENT_AMBULATORY_CARE_PROVIDER_SITE_OTHER): Admitting: Nurse Practitioner

## 2023-05-01 ENCOUNTER — Encounter: Payer: Self-pay | Admitting: Nurse Practitioner

## 2023-05-01 VITALS — BP 128/72 | HR 94 | Temp 98.3°F | Ht 68.0 in | Wt 255.6 lb

## 2023-05-01 DIAGNOSIS — Z0001 Encounter for general adult medical examination with abnormal findings: Secondary | ICD-10-CM

## 2023-05-01 DIAGNOSIS — Z13220 Encounter for screening for lipoid disorders: Secondary | ICD-10-CM | POA: Diagnosis not present

## 2023-05-01 DIAGNOSIS — Z136 Encounter for screening for cardiovascular disorders: Secondary | ICD-10-CM | POA: Diagnosis not present

## 2023-05-01 LAB — COMPREHENSIVE METABOLIC PANEL WITH GFR
ALT: 24 U/L (ref 0–35)
AST: 22 U/L (ref 0–37)
Albumin: 4.7 g/dL (ref 3.5–5.2)
Alkaline Phosphatase: 50 U/L (ref 39–117)
BUN: 8 mg/dL (ref 6–23)
CO2: 23 meq/L (ref 19–32)
Calcium: 9.2 mg/dL (ref 8.4–10.5)
Chloride: 104 meq/L (ref 96–112)
Creatinine, Ser: 0.74 mg/dL (ref 0.40–1.20)
GFR: 102.77 mL/min (ref >60.00)
Glucose, Bld: 87 mg/dL (ref 70–99)
Potassium: 4 meq/L (ref 3.5–5.1)
Sodium: 137 meq/L (ref 135–145)
Total Bilirubin: 0.8 mg/dL (ref 0.2–1.2)
Total Protein: 7.3 g/dL (ref 6.0–8.3)

## 2023-05-01 LAB — LIPID PANEL
Cholesterol: 199 mg/dL (ref 0–200)
HDL: 48.8 mg/dL (ref >39.00)
LDL Cholesterol: 124 mg/dL — ABNORMAL HIGH (ref 0–99)
NonHDL: 150.27
Total CHOL/HDL Ratio: 4
Triglycerides: 130 mg/dL (ref 0.0–149.0)
VLDL: 26 mg/dL (ref 0.0–40.0)

## 2023-05-01 LAB — CBC
HCT: 40.1 % (ref 36.0–46.0)
Hemoglobin: 13.3 g/dL (ref 12.0–15.0)
MCHC: 33.3 g/dL (ref 30.0–36.0)
MCV: 96.2 fl (ref 78.0–100.0)
Platelets: 335 10*3/uL (ref 150.0–400.0)
RBC: 4.17 Mil/uL (ref 3.87–5.11)
RDW: 12.7 % (ref 11.5–15.5)
WBC: 7.8 10*3/uL (ref 4.0–10.5)

## 2023-05-01 LAB — TSH: TSH: 1.26 u[IU]/mL (ref 0.35–5.50)

## 2023-05-01 NOTE — Patient Instructions (Addendum)
Go to lab Maintain Heart healthy diet and daily exercise.  Preventive Care 60-38 Years Old, Female Preventive care refers to lifestyle choices and visits with your health care provider that can promote health and wellness. Preventive care visits are also called wellness exams. What can I expect for my preventive care visit? Counseling During your preventive care visit, your health care provider may ask about your: Medical history, including: Past medical problems. Family medical history. Pregnancy history. Current health, including: Menstrual cycle. Method of birth control. Emotional well-being. Home life and relationship well-being. Sexual activity and sexual health. Lifestyle, including: Alcohol, nicotine or tobacco, and drug use. Access to firearms. Diet, exercise, and sleep habits. Work and work Astronomer. Sunscreen use. Safety issues such as seatbelt and bike helmet use. Physical exam Your health care provider may check your: Height and weight. These may be used to calculate your BMI (body mass index). BMI is a measurement that tells if you are at a healthy weight. Waist circumference. This measures the distance around your waistline. This measurement also tells if you are at a healthy weight and may help predict your risk of certain diseases, such as type 2 diabetes and high blood pressure. Heart rate and blood pressure. Body temperature. Skin for abnormal spots. What immunizations do I need?  Vaccines are usually given at various ages, according to a schedule. Your health care provider will recommend vaccines for you based on your age, medical history, and lifestyle or other factors, such as travel or where you work. What tests do I need? Screening Your health care provider may recommend screening tests for certain conditions. This may include: Pelvic exam and Pap test. Lipid and cholesterol levels. Diabetes screening. This is done by checking your blood sugar  (glucose) after you have not eaten for a while (fasting). Hepatitis B test. Hepatitis C test. HIV (human immunodeficiency virus) test. STI (sexually transmitted infection) testing, if you are at risk. BRCA-related cancer screening. This may be done if you have a family history of breast, ovarian, tubal, or peritoneal cancers. Talk with your health care provider about your test results, treatment options, and if necessary, the need for more tests. Follow these instructions at home: Eating and drinking  Eat a healthy diet that includes fresh fruits and vegetables, whole grains, lean protein, and low-fat dairy products. Take vitamin and mineral supplements as recommended by your health care provider. Do not drink alcohol if: Your health care provider tells you not to drink. You are pregnant, may be pregnant, or are planning to become pregnant. If you drink alcohol: Limit how much you have to 0-1 drink a day. Know how much alcohol is in your drink. In the [place] one drink equals one 12 oz bottle of beer (355 mL), one 5 oz glass of wine (148 mL), or one 1 oz glass of hard liquor (44 mL). Lifestyle Brush your teeth every morning and night with fluoride toothpaste. Floss one time each day. Exercise for at least 30 minutes 5 or more days each week. Do not use any products that contain nicotine or tobacco. These products include cigarettes, chewing tobacco, and vaping devices, such as e-cigarettes. If you need help quitting, ask your health care provider. Do not use drugs. If you are sexually active, practice safe sex. Use a condom or other form of protection to prevent STIs. If you do not wish to become pregnant, use a form of birth control. If you plan to become pregnant, see your health care provider for a  prepregnancy visit. Find healthy ways to manage stress, such as: Meditation, yoga, or listening to music. Journaling. Talking to a trusted person. Spending time with friends and  family. Minimize exposure to UV radiation to reduce your risk of skin cancer. Safety Always wear your seat belt while driving or riding in a vehicle. Do not drive: If you have been drinking alcohol. Do not ride with someone who has been drinking. If you have been using any mind-altering substances or drugs. While texting. When you are tired or distracted. Wear a helmet and other protective equipment during sports activities. If you have firearms in your house, make sure you follow all gun safety procedures. Seek help if you have been physically or sexually abused. What's next? Go to your health care provider once a year for an annual wellness visit. Ask your health care provider how often you should have your eyes and teeth checked. Stay up to date on all vaccines. This information is not intended to replace advice given to you by your health care provider. Make sure you discuss any questions you have with your health care provider. Document Revised: 07/11/2020 Document Reviewed: 07/11/2020 Elsevier Patient Education  2024 [employer].

## 2023-05-01 NOTE — Progress Notes (Signed)
 Complete physical exam  Patient: Tina Mcbride   DOB: 06-16-1985   38 y.o. Female  MRN: [id number] Visit Date: 05/01/2023  Subjective:    Chief Complaint  Patient presents with   Annual Exam   Tina Mcbride is a 38 y.o. female who presents today for a complete physical exam. She reports consuming a general diet.  No exercise regimen  She generally feels well. She reports sleeping well. She does have additional problems to discuss today.  Vision:No Dental:Yes STD Screen:No  BP Readings from Last 3 Encounters:  05/01/23 128/72  06/06/20 120/74  06/18/13 121/65   Wt Readings from Last 3 Encounters:  05/01/23 255 lb 9.6 oz (115.9 kg)  06/06/20 214 lb 6.4 oz (97.3 kg)   Most recent fall risk assessment:    05/01/2023    1:16 PM  Fall Risk   Falls in the past year? 0  Number falls in past yr: 0  Injury with Fall? 0  Risk for fall due to : No Fall Risks  Follow up Falls evaluation completed   Depression screen:Yes - No Depression Most recent depression screenings:    05/01/2023    1:16 PM 06/06/2020    2:06 PM  PHQ 2/9 Scores  PHQ - 2 Score 1 0  PHQ- 9 Score 3 2   HPI  No problem-specific Assessment & Plan notes found for this encounter.  History reviewed. No pertinent past medical history. History reviewed. No pertinent surgical history. Social History   Socioeconomic History   Marital status: Single    Spouse name: Not on file   Number of children: Not on file   Years of education: Not on file   Highest education level: Not on file  Occupational History   Not on file  Tobacco Use   Smoking status: Never   Smokeless tobacco: Never  Vaping Use   Vaping status: Never Used  Substance and Sexual Activity   Alcohol use: Yes    Alcohol/week: 8.0 standard drinks of alcohol    Types: 4 Glasses of wine, 4 Shots of liquor per week   Drug use: No   Sexual activity: Yes    Birth control/protection: None  Other Topics Concern   Not on file  Social History Narrative    Not on file   Social Drivers of Health   Financial Resource Strain: Not on file  Food Insecurity: Not on file  Transportation Needs: Not on file  Physical Activity: Not on file  Stress: Not on file  Social Connections: Not on file  Intimate Partner Violence: Not on file   Family Status  Relation Name Status   Mother  Alive   Father  Alive  No partnership data on file   Family History  Problem Relation Age of Onset   Diabetes Father    No Known Allergies  Patient Care Team: Seynabou Fults, Bonna Gains, NP as PCP - General (Internal Medicine)   Medications: Outpatient Medications Prior to Visit  Medication Sig   [DISCONTINUED] metroNIDAZOLE (FLAGYL) 500 MG tablet Take 1 tablet (500 mg total) by mouth 2 (two) times daily. (Patient not taking: Reported on 06/06/2020)   No facility-administered medications prior to visit.    Review of Systems  Constitutional:  Negative for activity change, appetite change and unexpected weight change.  Respiratory: Negative.    Cardiovascular: Negative.   Gastrointestinal: Negative.   Endocrine: Negative for cold intolerance and heat intolerance.  Genitourinary: Negative.   Musculoskeletal: Negative.   Skin: Negative.  Neurological: Negative.   Hematological: Negative.   Psychiatric/Behavioral:  Negative for behavioral problems, decreased concentration, dysphoric mood, hallucinations, self-injury, sleep disturbance and suicidal ideas. The patient is not nervous/anxious.         Objective:  BP 128/72 (BP Location: Left Arm, Patient Position: Sitting, Cuff Size: Large)   Pulse 94   Temp 98.3 F (36.8 C) (Temporal)   Ht 5\' 8"  (1.727 m)   Wt 255 lb 9.6 oz (115.9 kg)   LMP 04/11/2023   SpO2 98%   BMI 38.86 kg/m     Physical Exam Vitals and nursing note reviewed.  Constitutional:      General: She is not in acute distress. HENT:     Right Ear: Tympanic membrane, ear canal and external ear normal.     Left Ear: Tympanic membrane, ear  canal and external ear normal.     Nose: Nose normal.  Eyes:     Extraocular Movements: Extraocular movements intact.     Conjunctiva/sclera: Conjunctivae normal.     Pupils: Pupils are equal, round, and reactive to light.  Neck:     Thyroid: No thyroid mass, thyromegaly or thyroid tenderness.  Cardiovascular:     Rate and Rhythm: Normal rate and regular rhythm.     Pulses: Normal pulses.     Heart sounds: Normal heart sounds.  Pulmonary:     Effort: Pulmonary effort is normal.     Breath sounds: Normal breath sounds.  Abdominal:     General: Bowel sounds are normal.     Palpations: Abdomen is soft.  Musculoskeletal:        General: Normal range of motion.     Cervical back: Normal range of motion and neck supple.     Right lower leg: No edema.     Left lower leg: No edema.  Lymphadenopathy:     Cervical: No cervical adenopathy.  Skin:    General: Skin is warm and dry.  Neurological:     Mental Status: She is alert and oriented to person, place, and time.     Cranial Nerves: No cranial nerve deficit.  Psychiatric:        Mood and Affect: Mood normal.        Behavior: Behavior normal.        Thought Content: Thought content normal.      No results found for any visits on 05/01/23.    Assessment & Plan:    Routine Health Maintenance and Physical Exam  Immunization History  Administered Date(s) Administered   PFIZER Comirnaty(Gray Top)Covid-19 Tri-Sucrose Vaccine 08/18/2019, 09/08/2019   PFIZER(Purple Top)SARS-COV-2 Vaccination 08/18/2019, 09/08/2019   Tdap 09/11/2010    Health Maintenance  Topic Date Due   DTaP/Tdap/Td (2 - Td or Tdap) 09/10/2020   COVID-19 Vaccine (5 - 2024-25 season) 05/17/2023 (Originally 09/28/2022)   INFLUENZA VACCINE  08/28/2023   Cervical Cancer Screening (HPV/Pap Cotest)  11/02/2023   Hepatitis C Screening  Completed   HIV Screening  Completed   HPV VACCINES  Aged Out    Discussed health benefits of physical activity, and encouraged  her to engage in regular exercise appropriate for her age and condition.  Problem List Items Addressed This Visit   None Visit Diagnoses       Encounter for preventative adult health care exam with abnormal findings    -  Primary   Relevant Orders   CBC   Lipid panel   Comprehensive metabolic panel with GFR   TSH  Encounter for lipid screening for cardiovascular disease       Relevant Orders   Lipid panel      Return in about 1 year (around 04/30/2024) for CPE (fasting).     Alysia Penna, NP

## 2023-05-26 ENCOUNTER — Encounter: Admitting: Nurse Practitioner
